# Patient Record
Sex: Female | Born: 1965
Health system: Southern US, Community
[De-identification: ages and names within clinical notes are randomized; demographics above are authoritative.]

## PROBLEM LIST (undated history)

## (undated) DIAGNOSIS — E039 Hypothyroidism, unspecified: Secondary | ICD-10-CM

## (undated) DIAGNOSIS — Z720 Tobacco use: Secondary | ICD-10-CM

## (undated) DIAGNOSIS — F419 Anxiety disorder, unspecified: Secondary | ICD-10-CM

## (undated) DIAGNOSIS — E785 Hyperlipidemia, unspecified: Secondary | ICD-10-CM

## (undated) DIAGNOSIS — K5190 Ulcerative colitis, unspecified, without complications: Secondary | ICD-10-CM

## (undated) HISTORY — DX: Ulcerative colitis, unspecified, without complications: K51.90

## (undated) HISTORY — DX: Hypothyroidism, unspecified: E03.9

## (undated) HISTORY — DX: Tobacco use: Z72.0

## (undated) HISTORY — PX: CYSTECTOMY: SUR359

## (undated) HISTORY — PX: ABLATION: SHX5711

## (undated) HISTORY — DX: Anxiety disorder, unspecified: F41.9

## (undated) HISTORY — DX: Hyperlipidemia, unspecified: E78.5

---

## 2018-12-27 DIAGNOSIS — Z23 Encounter for immunization: Secondary | ICD-10-CM | POA: Diagnosis not present

## 2018-12-27 DIAGNOSIS — E039 Hypothyroidism, unspecified: Secondary | ICD-10-CM | POA: Diagnosis not present

## 2018-12-27 DIAGNOSIS — Z13220 Encounter for screening for lipoid disorders: Secondary | ICD-10-CM | POA: Diagnosis not present

## 2018-12-27 DIAGNOSIS — K5190 Ulcerative colitis, unspecified, without complications: Secondary | ICD-10-CM | POA: Diagnosis not present

## 2018-12-27 DIAGNOSIS — Z1159 Encounter for screening for other viral diseases: Secondary | ICD-10-CM | POA: Diagnosis not present

## 2019-01-23 DIAGNOSIS — E039 Hypothyroidism, unspecified: Secondary | ICD-10-CM | POA: Diagnosis not present

## 2019-03-30 ENCOUNTER — Other Ambulatory Visit: Payer: Self-pay | Admitting: Family Medicine

## 2019-03-30 ENCOUNTER — Other Ambulatory Visit (HOSPITAL_COMMUNITY)
Admission: RE | Admit: 2019-03-30 | Discharge: 2019-03-30 | Disposition: A | Discharge status: Home / Self Care | Payer: BC Managed Care – PPO | Source: Ambulatory Visit | Attending: Family Medicine | Admitting: Family Medicine

## 2019-03-30 DIAGNOSIS — Z01411 Encounter for gynecological examination (general) (routine) with abnormal findings: Secondary | ICD-10-CM | POA: Diagnosis not present

## 2019-03-30 DIAGNOSIS — Z0000 Encounter for general adult medical examination without abnormal findings: Secondary | ICD-10-CM | POA: Diagnosis not present

## 2019-04-05 LAB — CYTOLOGY - PAP
Comment: NEGATIVE
Diagnosis: NEGATIVE
High risk HPV: NEGATIVE

## 2019-11-19 DIAGNOSIS — K047 Periapical abscess without sinus: Secondary | ICD-10-CM | POA: Diagnosis not present

## 2019-11-19 DIAGNOSIS — K0381 Cracked tooth: Secondary | ICD-10-CM | POA: Diagnosis not present

## 2019-11-19 DIAGNOSIS — G501 Atypical facial pain: Secondary | ICD-10-CM | POA: Diagnosis not present

## 2019-11-19 DIAGNOSIS — Z719 Counseling, unspecified: Secondary | ICD-10-CM | POA: Diagnosis not present

## 2020-04-01 DIAGNOSIS — Z0000 Encounter for general adult medical examination without abnormal findings: Secondary | ICD-10-CM | POA: Diagnosis not present

## 2020-04-01 DIAGNOSIS — E7800 Pure hypercholesterolemia, unspecified: Secondary | ICD-10-CM | POA: Diagnosis not present

## 2020-04-01 DIAGNOSIS — E039 Hypothyroidism, unspecified: Secondary | ICD-10-CM | POA: Diagnosis not present

## 2020-04-01 DIAGNOSIS — Z23 Encounter for immunization: Secondary | ICD-10-CM | POA: Diagnosis not present

## 2020-05-21 DIAGNOSIS — E039 Hypothyroidism, unspecified: Secondary | ICD-10-CM | POA: Diagnosis not present

## 2020-06-11 DIAGNOSIS — Z20822 Contact with and (suspected) exposure to covid-19: Secondary | ICD-10-CM | POA: Diagnosis not present

## 2020-06-12 DIAGNOSIS — Z20822 Contact with and (suspected) exposure to covid-19: Secondary | ICD-10-CM | POA: Diagnosis not present

## 2020-06-18 DIAGNOSIS — Z20822 Contact with and (suspected) exposure to covid-19: Secondary | ICD-10-CM | POA: Diagnosis not present

## 2020-06-19 DIAGNOSIS — Z20822 Contact with and (suspected) exposure to covid-19: Secondary | ICD-10-CM | POA: Diagnosis not present

## 2020-06-20 DIAGNOSIS — Z20822 Contact with and (suspected) exposure to covid-19: Secondary | ICD-10-CM | POA: Diagnosis not present

## 2020-07-23 DIAGNOSIS — E039 Hypothyroidism, unspecified: Secondary | ICD-10-CM | POA: Diagnosis not present

## 2021-01-23 DIAGNOSIS — M2550 Pain in unspecified joint: Secondary | ICD-10-CM | POA: Diagnosis not present

## 2021-01-23 DIAGNOSIS — D1720 Benign lipomatous neoplasm of skin and subcutaneous tissue of unspecified limb: Secondary | ICD-10-CM | POA: Diagnosis not present

## 2021-01-23 DIAGNOSIS — Z23 Encounter for immunization: Secondary | ICD-10-CM | POA: Diagnosis not present

## 2021-01-23 DIAGNOSIS — K5190 Ulcerative colitis, unspecified, without complications: Secondary | ICD-10-CM | POA: Diagnosis not present

## 2021-02-05 ENCOUNTER — Other Ambulatory Visit: Payer: Self-pay | Admitting: Sports Medicine

## 2021-02-05 ENCOUNTER — Ambulatory Visit
Admission: RE | Admit: 2021-02-05 | Discharge: 2021-02-05 | Disposition: A | Discharge status: Home / Self Care | Payer: BC Managed Care – PPO | Source: Ambulatory Visit | Attending: Sports Medicine | Admitting: Sports Medicine

## 2021-02-05 DIAGNOSIS — M25551 Pain in right hip: Secondary | ICD-10-CM | POA: Diagnosis not present

## 2021-02-05 DIAGNOSIS — M25561 Pain in right knee: Secondary | ICD-10-CM | POA: Diagnosis not present

## 2021-02-05 DIAGNOSIS — R52 Pain, unspecified: Secondary | ICD-10-CM

## 2021-02-14 DIAGNOSIS — D1720 Benign lipomatous neoplasm of skin and subcutaneous tissue of unspecified limb: Secondary | ICD-10-CM | POA: Diagnosis not present

## 2021-03-21 DIAGNOSIS — D171 Benign lipomatous neoplasm of skin and subcutaneous tissue of trunk: Secondary | ICD-10-CM | POA: Diagnosis not present

## 2021-03-21 DIAGNOSIS — D1721 Benign lipomatous neoplasm of skin and subcutaneous tissue of right arm: Secondary | ICD-10-CM | POA: Diagnosis not present

## 2021-04-03 DIAGNOSIS — E7800 Pure hypercholesterolemia, unspecified: Secondary | ICD-10-CM | POA: Diagnosis not present

## 2021-04-03 DIAGNOSIS — K5190 Ulcerative colitis, unspecified, without complications: Secondary | ICD-10-CM | POA: Diagnosis not present

## 2021-04-03 DIAGNOSIS — Z0000 Encounter for general adult medical examination without abnormal findings: Secondary | ICD-10-CM | POA: Diagnosis not present

## 2021-04-03 DIAGNOSIS — Z23 Encounter for immunization: Secondary | ICD-10-CM | POA: Diagnosis not present

## 2021-04-03 DIAGNOSIS — E039 Hypothyroidism, unspecified: Secondary | ICD-10-CM | POA: Diagnosis not present

## 2021-04-03 DIAGNOSIS — F411 Generalized anxiety disorder: Secondary | ICD-10-CM | POA: Diagnosis not present

## 2021-04-24 ENCOUNTER — Encounter: Payer: Self-pay | Admitting: Gastroenterology

## 2021-05-15 DIAGNOSIS — U071 COVID-19: Secondary | ICD-10-CM | POA: Diagnosis not present

## 2021-05-29 ENCOUNTER — Ambulatory Visit (INDEPENDENT_AMBULATORY_CARE_PROVIDER_SITE_OTHER): Payer: BC Managed Care – PPO | Admitting: Gastroenterology

## 2021-05-29 ENCOUNTER — Encounter: Payer: Self-pay | Admitting: Gastroenterology

## 2021-05-29 VITALS — BP 102/80 | HR 83 | Ht 67.0 in | Wt 186.0 lb

## 2021-05-29 DIAGNOSIS — K5190 Ulcerative colitis, unspecified, without complications: Secondary | ICD-10-CM

## 2021-05-29 MED ORDER — PLENVU 140 G PO SOLR: 140.0000 g | ORAL | 0 refills | Status: DC

## 2021-05-29 NOTE — Progress Notes (Signed)
[facility] Consult Note:  History: Ashlee Hall 05/29/2021  Referring provider: Glenis Smoker, MD  Reason for consult/chief complaint: Ulcerative Colitis (Patient is not having any issues she is just establishing care.)   Subjective  HPI:  This is a very pleasant 56 year old woman referred by primary care for ongoing management of ulcerative colitis.  Records from her previous GI physician in [place] are not presently available.  She tried to get them, but was unable to do so because that physician passed away, and his practice could not be reached to obtain records.  She recalls being diagnosed about 2010 when she had abdominal pain bleeding and diarrhea.  To the best of her recollection, the condition was involving at least the entire left side of the colon and perhaps more than that.  She believes she has had 3 or 4 colonoscopies, not sure if there may have been polyps on one.  The only treatment she recalls is Lialda (mesalamine), which worked well for many years and she did not have flares requiring prednisone or escalation of therapy.  She stopped the medicine a few years ago because of its cost and they also moved to the state and changed insurance.  Despite that, she has remained under good control without recurrent symptoms. Ashlee Hall also recalls having had a sigmoidoscopy in her 36s and was diagnosed with possible proctitis, though records of that are also unavailable.  She denies painful eye redness, joint swelling or rash.  ROS:  Review of Systems  Constitutional:  Negative for appetite change and unexpected weight change.  HENT:  Negative for mouth sores and voice change.   Eyes:  Negative for pain and redness.  Respiratory:  Negative for cough and shortness of breath.   Cardiovascular:  Negative for chest pain and palpitations.  Genitourinary:  Negative for dysuria and hematuria.  Musculoskeletal:  Negative for arthralgias and myalgias.   Skin:  Negative for pallor and rash.  Neurological:  Negative for weakness and headaches.  Hematological:  Negative for adenopathy.    Past Medical History: Past Medical History:  Diagnosis Date   Anxiety    Hyperlipidemia    Hypothyroidism    Tobacco use    Ulcerative colitis ([place])      Past Surgical History: Past Surgical History:  Procedure Laterality Date   ABLATION     CYSTECTOMY       Family History: Family History  Problem Relation Age of Onset   COPD Mother    Hypertension Mother    CVA Mother    Pulmonary embolism Father    Drug abuse Brother    Crohn's disease Son    Colon cancer Neg Hx    Stomach cancer Neg Hx     Social History: Social History   Socioeconomic History   Marital status: Unknown    Spouse name: Not on file   Number of children: Not on file   Years of education: Not on file   Highest education level: Not on file  Occupational History   Not on file  Tobacco Use   Smoking status: Every Day    Types: Cigarettes   Smokeless tobacco: Never  Vaping Use   Vaping Use: Never used  Substance and Sexual Activity   Alcohol use: Yes   Drug use: Never   Sexual activity: Yes  Other Topics Concern   Not on file  Social History Narrative   Not on file   Social Determinants of Health   Financial  Resource Strain: Not on file  Food Insecurity: Not on file  Transportation Needs: Not on file  Physical Activity: Not on file  Stress: Not on file  Social Connections: Not on file    Allergies: Not on File  Outpatient Meds: Current Outpatient Medications  Medication Sig Dispense Refill   B Complex Vitamins (VITAMIN B COMPLEX) TABS 1 tablet     cyanocobalamin 1000 MCG tablet Take by mouth.     levothyroxine (SYNTHROID) 137 MCG tablet TAKE 1 TABLET BY MOUTH EVERY MORNING ON EMPTY STOMACH     PARoxetine (PAXIL) 40 MG tablet TAKE 1 TABLET BY MOUTH EVERY DAY IN THE MORNING     PEG-KCl-NaCl-NaSulf-Na Asc-C (PLENVU) 140 g SOLR Take 140 g by  mouth as directed. 1 each 0   No current facility-administered medications for this visit.      ___________________________________________________________________ Objective   Exam:  BP 102/80    Pulse 83    Ht 5\' 7"  (1.702 m)    Wt 186 lb (84.4 kg)    BMI 29.13 kg/m  Wt Readings from Last 3 Encounters:  05/29/21 186 lb (84.4 kg)    General: Well-appearing Eyes: sclera anicteric, no redness ENT: oral mucosa moist without lesions, no cervical or supraclavicular lymphadenopathy CV: RRR without murmur, S1/S2, no JVD, no peripheral edema Resp: clear to auscultation bilaterally, normal RR and effort noted GI: soft, no tenderness, with active bowel sounds. No guarding or palpable organomegaly noted. Skin; warm and dry, no rash or jaundice noted Neuro: awake, alert and oriented x 3. Normal gross motor function and fluent speech  Labs: 04/03/2021  TSH 5.18, free T41.2 CBC CMP normal   Assessment: Encounter Diagnosis  Name Primary?   Ulcerative colitis without complications, unspecified location [facility]) Yes    Ulcerative colitis diagnosed approximately 2010, extent of disease unknown but may have been more than left-sided colitis based on patient's recollection.  She had reportedly excellent response and long remission with only mesalamine, and has remained under good symptomatic control off that medicine for the last few years.  No extraintestinal manifestations at present.  Plan:  Colonoscopy to assess disease activity and take biopsies to assess for microscopic activity even if not overtly active. She was agreeable after discussion of procedure and risks.  The benefits and risks of the planned procedure were described in detail with the patient or (when appropriate) their health care proxy.  Risks were outlined as including, but not limited to, bleeding, infection, perforation, adverse medication reaction leading to cardiac or pulmonary decompensation, pancreatitis (if ERCP).   The limitation of incomplete mucosal visualization was also discussed.  No guarantees or warranties were given.  Based on the colonoscopy findings, we can then determine whether or not she requires long-term medicine for this.  She is up-to-date with primary care on health maintenance and lab testing. I recommended she contact primary care to arrange a bone density study given her history of IBD.   Thank you for the courtesy of this consult.  Please call me with any questions or concerns.  Nelida Meuse III  CC: Referring provider noted above

## 2021-05-29 NOTE — Patient Instructions (Signed)
If you are age 54 or older, your body mass index should be between 23-30. Your Body mass index is 29.13 kg/m. If this is out of the aforementioned range listed, please consider follow up with your Primary Care Provider.  If you are age 79 or younger, your body mass index should be between 19-25. Your Body mass index is 29.13 kg/m. If this is out of the aformentioned range listed, please consider follow up with your Primary Care Provider.   ________________________________________________________  The [facility] providers would like to encourage you to use [facility] to communicate with providers for non-urgent requests or questions.  Due to long hold times on the telephone, sending your provider a message by [facility] may be a faster and more efficient way to get a response.  Please allow 48 business hours for a response.  Please remember that this is for non-urgent requests.  _______________________________________________________  Ashlee Hall have been scheduled for a colonoscopy. Please follow written instructions given to you at your visit today.  Please pick up your prep supplies at the pharmacy within the next 1-3 days. If you use inhalers (even only as needed), please bring them with you on the day of your procedure.  It was a pleasure to see you today!  Thank you for trusting me with your gastrointestinal care!

## 2021-06-16 ENCOUNTER — Telehealth: Payer: Self-pay | Admitting: Gastroenterology

## 2021-06-16 ENCOUNTER — Other Ambulatory Visit: Payer: Self-pay | Admitting: Family Medicine

## 2021-06-16 DIAGNOSIS — Z1231 Encounter for screening mammogram for malignant neoplasm of breast: Secondary | ICD-10-CM

## 2021-06-16 NOTE — Telephone Encounter (Signed)
Inbound call from patient states plenvu is not covered by insurance and need an alternative.

## 2021-06-17 ENCOUNTER — Other Ambulatory Visit: Payer: Self-pay

## 2021-06-17 MED ORDER — PEG 3350-KCL-NA BICARB-NACL 420 G PO SOLR: 4000.0000 mL | Freq: Once | ORAL | 0 refills | Status: AC

## 2021-06-17 NOTE — Telephone Encounter (Signed)
New prep replacement sent ( Golytely ). I have also mailed new instructions to the patients home address on file.

## 2021-06-20 DIAGNOSIS — M25551 Pain in right hip: Secondary | ICD-10-CM | POA: Diagnosis not present

## 2021-06-27 ENCOUNTER — Ambulatory Visit
Admission: RE | Admit: 2021-06-27 | Discharge: 2021-06-27 | Disposition: A | Discharge status: Home / Self Care | Payer: BC Managed Care – PPO | Source: Ambulatory Visit | Attending: Family Medicine | Admitting: Family Medicine

## 2021-06-27 DIAGNOSIS — Z1231 Encounter for screening mammogram for malignant neoplasm of breast: Secondary | ICD-10-CM | POA: Diagnosis not present

## 2021-07-10 ENCOUNTER — Encounter: Payer: BC Managed Care – PPO | Admitting: Gastroenterology

## 2021-07-22 ENCOUNTER — Encounter: Payer: Self-pay | Admitting: Gastroenterology

## 2021-07-30 ENCOUNTER — Encounter: Payer: Self-pay | Admitting: Gastroenterology

## 2021-07-30 ENCOUNTER — Ambulatory Visit (AMBULATORY_SURGERY_CENTER): Payer: BC Managed Care – PPO | Admitting: Gastroenterology

## 2021-07-30 VITALS — BP 110/61 | HR 59 | Temp 96.8°F | Resp 9 | Ht 67.0 in | Wt 186.0 lb

## 2021-07-30 DIAGNOSIS — D124 Benign neoplasm of descending colon: Secondary | ICD-10-CM | POA: Diagnosis not present

## 2021-07-30 DIAGNOSIS — K621 Rectal polyp: Secondary | ICD-10-CM

## 2021-07-30 DIAGNOSIS — K5100 Ulcerative (chronic) pancolitis without complications: Secondary | ICD-10-CM

## 2021-07-30 DIAGNOSIS — K635 Polyp of colon: Secondary | ICD-10-CM | POA: Diagnosis not present

## 2021-07-30 DIAGNOSIS — D122 Benign neoplasm of ascending colon: Secondary | ICD-10-CM

## 2021-07-30 MED ORDER — SODIUM CHLORIDE 0.9 % IV SOLN: 500.0000 mL | Freq: Once | INTRAVENOUS | Status: DC

## 2021-07-30 NOTE — Progress Notes (Signed)
Called to room to assist during endoscopic procedure.  Patient ID and intended procedure confirmed with present staff. Received instructions for my participation in the procedure from the performing physician.  

## 2021-07-30 NOTE — Progress Notes (Signed)
History and Physical: ? This patient presents for endoscopic testing for: ?Encounter Diagnosis  ?Name Primary?  ? Ulcerative pancolitis without complication ([place]) Yes  ? ? ?Clinical details in 05/29/21 [facility] office consult note. ?UC Dx 2010, long term remission off meds. ?Colonoscopy to assess disease activity and take dysplasia surveillance biopsies. ? ?ROS: ?Patient denies chest pain or shortness of breath ? ? ?Past Medical History: ?Past Medical History:  ?Diagnosis Date  ? Anxiety   ? Hyperlipidemia   ? Hypothyroidism   ? Tobacco use   ? Ulcerative colitis ([place])   ? ? ? ?Past Surgical History: ?Past Surgical History:  ?Procedure Laterality Date  ? ABLATION    ? CYSTECTOMY    ? ? ?Allergies: ?Not on File ? ?Outpatient Meds: ?Current Outpatient Medications  ?Medication Sig Dispense Refill  ? B Complex Vitamins (VITAMIN B COMPLEX) TABS 1 tablet    ? cyanocobalamin 1000 MCG tablet Take by mouth.    ? levothyroxine (SYNTHROID) 137 MCG tablet TAKE 1 TABLET BY MOUTH EVERY MORNING ON EMPTY STOMACH    ? PARoxetine (PAXIL) 40 MG tablet TAKE 1 TABLET BY MOUTH EVERY DAY IN THE MORNING    ? ?Current Facility-Administered Medications  ?Medication Dose Route Frequency Provider Last Rate Last Admin  ? 0.9 %  sodium chloride infusion  500 mL Intravenous Once Doran Stabler, MD      ? ? ? ? ?___________________________________________________________________ ?Objective  ? ?Exam: ? ?BP 104/61 (BP Location: Right Arm, Patient Position: Sitting, Cuff Size: Normal)   Pulse 97   Temp (!) 96.8 ?F (36 ?C) (Temporal)   Ht '5\' 7"'$  (1.702 m)   Wt 186 lb (84.4 kg)   LMP  (LMP Unknown)   SpO2 96%   BMI 29.13 kg/m?  ? ?CV: RRR without murmur, S1/S2 ?Resp: clear to auscultation bilaterally, normal RR and effort noted ?GI: soft, no tenderness, with active bowel sounds. ? ? ?Assessment: ?Encounter Diagnosis  ?Name Primary?  ? Ulcerative pancolitis without complication ([place]) Yes  ? ? ? ?Plan: ?Colonoscopy ? The benefits and risks of the  planned procedure were described in detail with the patient or (when appropriate) their health care proxy.  Risks were outlined as including, but not limited to, bleeding, infection, perforation, adverse medication reaction leading to cardiac or pulmonary decompensation, pancreatitis (if ERCP).  The limitation of incomplete mucosal visualization was also discussed.  No guarantees or warranties were given. ? ? ? ?The patient is appropriate for an endoscopic procedure in the ambulatory setting. ? ? - Wilfrid Lund, MD ? ? ? ? ?

## 2021-07-30 NOTE — Progress Notes (Signed)
Sedate, gd SR, tolerated procedure well, VSS, report to RN 

## 2021-07-30 NOTE — Progress Notes (Signed)
Vitals-DT  Pt's states no medical or surgical changes since previsit or office visit.  

## 2021-07-30 NOTE — Patient Instructions (Addendum)
Handouts provided on polyps and hemorrhoids.   YOU HAD AN ENDOSCOPIC PROCEDURE TODAY AT THE [facility]:   Refer to the procedure report that was given to you for any specific questions about what was found during the examination.  If the procedure report does not answer your questions, please call your gastroenterologist to clarify.  If you requested that your care partner not be given the details of your procedure findings, then the procedure report has been included in a sealed envelope for you to review at your convenience later.  YOU SHOULD EXPECT: Some feelings of bloating in the abdomen. Passage of more gas than usual.  Walking can help get rid of the air that was put into your GI tract during the procedure and reduce the bloating. If you had a lower endoscopy (such as a colonoscopy or flexible sigmoidoscopy) you may notice spotting of blood in your stool or on the toilet paper. If you underwent a bowel prep for your procedure, you may not have a normal bowel movement for a few days.  Please Note:  You might notice some irritation and congestion in your nose or some drainage.  This is from the oxygen used during your procedure.  There is no need for concern and it should clear up in a day or so.  SYMPTOMS TO REPORT IMMEDIATELY:  Following lower endoscopy (colonoscopy or flexible sigmoidoscopy):  Excessive amounts of blood in the stool  Significant tenderness or worsening of abdominal pains  Swelling of the abdomen that is new, acute  Fever of 100F or higher  For urgent or emergent issues, a gastroenterologist can be reached at any hour by calling [phone]. Do not use MyChart messaging for urgent concerns.    DIET:  We do recommend a small meal at first, but then you may proceed to your regular diet.  Drink plenty of fluids but you should avoid alcoholic beverages for 24 hours.  ACTIVITY:  You should plan to take it easy for the rest of today and you should NOT DRIVE  or use heavy machinery until tomorrow (because of the sedation medicines used during the test).    FOLLOW UP: Our staff will call the number listed on your records 48-72 hours following your procedure to check on you and address any questions or concerns that you may have regarding the information given to you following your procedure. If we do not reach you, we will leave a message.  We will attempt to reach you two times.  During this call, we will ask if you have developed any symptoms of COVID 19. If you develop any symptoms (ie: fever, flu-like symptoms, shortness of breath, cough etc.) before then, please call [phone].  If you test positive for Covid 19 in the 2 weeks post procedure, please call and report this information to us.    If any biopsies were taken you will be contacted by phone or by letter within the next 1-3 weeks.  Please call us at [phone] if you have not heard about the biopsies in 3 weeks.    SIGNATURES/CONFIDENTIALITY: You and/or your care partner have signed paperwork which will be entered into your electronic medical record.  These signatures attest to the fact that that the information above on your After Visit Summary has been reviewed and is understood.  Full responsibility of the confidentiality of this discharge information lies with you and/or your care-partner.  

## 2021-07-30 NOTE — Op Note (Signed)
[facility] ?Patient Name: Ashlee Hall ?Procedure Date: 07/30/2021 9:48 AM ?MRN: [id number] ?Endoscopist: Estill Cotta. Loletha Carrow , MD ?Age: 56 ?Referring MD:  ?Date of Birth: 1965/12/11 ?Gender: Female ?Account #: [id number] ?Procedure:                Colonoscopy ?Indications:              Disease activity assessment of chronic ulcerative  ?                          pancolitis, and dysplasia screening. ?                          Dx 2010, remission off meds several years. ?Medicines:                Monitored Anesthesia Care ?Procedure:                Pre-Anesthesia Assessment: ?                          - Prior to the procedure, a History and Physical  ?                          was performed, and patient medications and  ?                          allergies were reviewed. The patient's tolerance of  ?                          previous anesthesia was also reviewed. The risks  ?                          and benefits of the procedure and the sedation  ?                          options and risks were discussed with the patient.  ?                          All questions were answered, and informed consent  ?                          was obtained. Prior Anticoagulants: The patient has  ?                          taken no previous anticoagulant or antiplatelet  ?                          agents. ASA Grade Assessment: II - A patient with  ?                          mild systemic disease. After reviewing the risks  ?                          and benefits, the patient was deemed in  ?  satisfactory condition to undergo the procedure. ?                          After obtaining informed consent, the colonoscope  ?                          was passed under direct vision. Throughout the  ?                          procedure, the patient's blood pressure, pulse, and  ?                          oxygen saturations were monitored continuously. The  ?                          Olympus CF-HQ190L ([id number])  Colonoscope was  ?                          introduced through the anus and advanced to the the  ?                          terminal ileum, with identification of the  ?                          appendiceal orifice and IC valve. The colonoscopy  ?                          was performed without difficulty. The patient  ?                          tolerated the procedure well. The quality of the  ?                          bowel preparation was good after lavage. The  ?                          terminal ileum, ileocecal valve, appendiceal  ?                          orifice, and rectum were photographed. The bowel  ?                          preparation used was GoLYTELY. ?Scope In: 9:58:13 AM ?Scope Out: 10:33:44 AM ?Scope Withdrawal Time: 0 hours 30 minutes 50 seconds  ?Total Procedure Duration: 0 hours 35 minutes 31 seconds  ?Findings:                 The perianal and digital rectal examinations were  ?                          normal. ?                          The terminal ileum appeared normal. ?  Three flat and semi-sessile polyps were found in  ?                          the proximal ascending colon. The polyps were 2 to  ?                          10 mm in size. These polyps were removed with a  ?                          cold snare. Resection and retrieval were complete. ?                          A 4 mm polyp was found in the descending colon. The  ?                          polyp was semi-sessile. The polyp was removed with  ?                          a cold snare. Resection and retrieval were complete. ?                          Normal mucosa was found in the entire colon  ?                          (examined under WL and NBI). Four biopsies each  ?                          were obtained in the rectum, in the proximal  ?                          sigmoid colon, in the distal sigmoid colon, in the  ?                          descending colon, in the proximal transverse colon,  ?                           in the mid transverse colon, in the distal  ?                          transverse colon, in the proximal ascending colon,  ?                          in the distal ascending colon and in the cecum with  ?                          cold forceps for evaluation of inflammatory bowel  ?                          disease. ?                          External hemorrhoids were found. ?  The exam was otherwise without abnormality on  ?                          direct and retroflexion views. ?Complications:            No immediate complications. ?Estimated Blood Loss:     Estimated blood loss was minimal. ?Impression:               - The examined portion of the ileum was normal. ?                          - Three 2 to 10 mm polyps in the proximal ascending  ?                          colon, removed with a cold snare. Resected and  ?                          retrieved. ?                          - One 4 mm polyp in the descending colon, removed  ?                          with a cold snare. Resected and retrieved. ?                          - Normal mucosa in the entire examined colon. ?                          - External hemorrhoids. ?                          - The examination was otherwise normal on direct  ?                          and retroflexion views. ?                          - Four biopsies were obtained in the rectum, in the  ?                          proximal sigmoid colon, in the distal sigmoid  ?                          colon, in the descending colon, in the proximal  ?                          transverse colon, in the mid transverse colon, in  ?                          the distal transverse colon, in the proximal  ?                          ascending colon, in the distal ascending colon and  ?  in the cecum. ?Recommendation:           - Patient has a contact number available for  ?                          emergencies. The signs and symptoms  of potential  ?                          delayed complications were discussed with the  ?                          patient. Return to normal activities tomorrow.  ?                          Written discharge instructions were provided to the  ?                          patient. ?                          - Resume previous diet. ?                          - Continue present medications. ?                          - Await pathology results. ?                          - Repeat colonoscopy is recommended for  ?                          surveillance. The colonoscopy date will be  ?                          determined after pathology results from today's  ?                          exam become available for review. ?Seaton Hofmann L. Loletha Carrow, MD ?07/30/2021 10:44:30 AM ?This report has been signed electronically. ?

## 2021-08-01 ENCOUNTER — Telehealth: Payer: Self-pay

## 2021-08-01 ENCOUNTER — Encounter: Payer: Self-pay | Admitting: Gastroenterology

## 2021-08-01 NOTE — Telephone Encounter (Signed)
?  Follow up Call- ? ? ?  07/30/2021  ?  9:24 AM  ?Call back number  ?Post procedure Call Back phone  # [phone]  ?Permission to leave phone message Yes  ?  ? ?Patient questions: ? ?Do you have a fever, pain , or abdominal swelling? No. ?Pain Score  0 * ? ?Have you tolerated food without any problems? Yes.   ? ?Have you been able to return to your normal activities? Yes.   ? ?Do you have any questions about your discharge instructions: ?Diet   No. ?Medications  No. ?Follow up visit  No. ? ?Do you have questions or concerns about your Care? No. ? ?Actions: ?* If pain score is 4 or above: ?No action needed, pain <4. ? ? ?

## 2021-10-10 DIAGNOSIS — F411 Generalized anxiety disorder: Secondary | ICD-10-CM | POA: Diagnosis not present

## 2021-10-10 DIAGNOSIS — K5190 Ulcerative colitis, unspecified, without complications: Secondary | ICD-10-CM | POA: Diagnosis not present

## 2021-10-10 DIAGNOSIS — Z79899 Other long term (current) drug therapy: Secondary | ICD-10-CM | POA: Diagnosis not present

## 2021-10-10 DIAGNOSIS — E039 Hypothyroidism, unspecified: Secondary | ICD-10-CM | POA: Diagnosis not present

## 2021-12-15 DIAGNOSIS — E039 Hypothyroidism, unspecified: Secondary | ICD-10-CM | POA: Diagnosis not present

## 2022-04-17 DIAGNOSIS — E039 Hypothyroidism, unspecified: Secondary | ICD-10-CM | POA: Diagnosis not present

## 2022-04-17 DIAGNOSIS — Z79899 Other long term (current) drug therapy: Secondary | ICD-10-CM | POA: Diagnosis not present

## 2022-04-17 DIAGNOSIS — Z0000 Encounter for general adult medical examination without abnormal findings: Secondary | ICD-10-CM | POA: Diagnosis not present

## 2022-04-17 DIAGNOSIS — Z23 Encounter for immunization: Secondary | ICD-10-CM | POA: Diagnosis not present

## 2022-04-17 DIAGNOSIS — E7800 Pure hypercholesterolemia, unspecified: Secondary | ICD-10-CM | POA: Diagnosis not present

## 2022-04-28 ENCOUNTER — Other Ambulatory Visit (HOSPITAL_COMMUNITY): Payer: Self-pay | Admitting: Family Medicine

## 2022-04-28 DIAGNOSIS — E786 Lipoprotein deficiency: Secondary | ICD-10-CM

## 2022-05-08 ENCOUNTER — Ambulatory Visit (HOSPITAL_COMMUNITY)
Admission: RE | Admit: 2022-05-08 | Discharge: 2022-05-08 | Disposition: A | Discharge status: Home / Self Care | Payer: BC Managed Care – PPO | Source: Ambulatory Visit | Attending: Family Medicine | Admitting: Family Medicine

## 2022-05-08 DIAGNOSIS — E786 Lipoprotein deficiency: Secondary | ICD-10-CM

## 2022-06-04 DIAGNOSIS — R1310 Dysphagia, unspecified: Secondary | ICD-10-CM | POA: Diagnosis not present

## 2022-06-04 DIAGNOSIS — R0981 Nasal congestion: Secondary | ICD-10-CM | POA: Diagnosis not present

## 2022-06-04 DIAGNOSIS — J029 Acute pharyngitis, unspecified: Secondary | ICD-10-CM | POA: Diagnosis not present

## 2022-12-13 IMAGING — MG MM DIGITAL SCREENING BILAT W/ TOMO AND CAD
8 series · 8 of 24 positions shown · non-contrast
Comparison: None.

CLINICAL DATA: Screening.

EXAM:
DIGITAL SCREENING BILATERAL MAMMOGRAM WITH TOMOSYNTHESIS AND CAD
TECHNIQUE: Bilateral screening digital craniocaudal and mediolateral oblique
mammograms were obtained. Bilateral screening digital breast
tomosynthesis was performed. The images were evaluated with
computer-aided detection.

[L CC synth-2D]
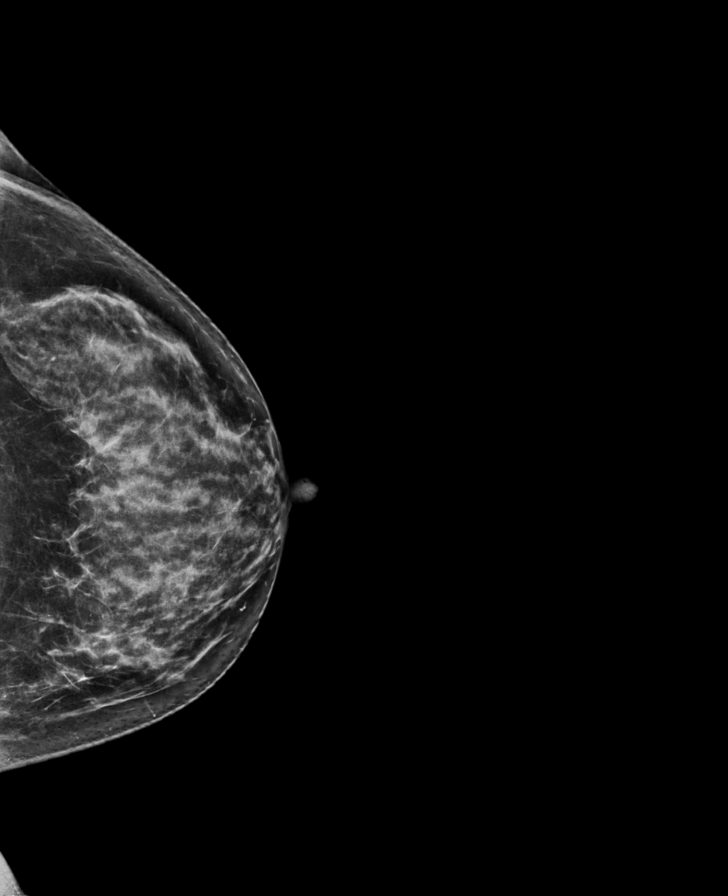

[L MLO synth-2D]
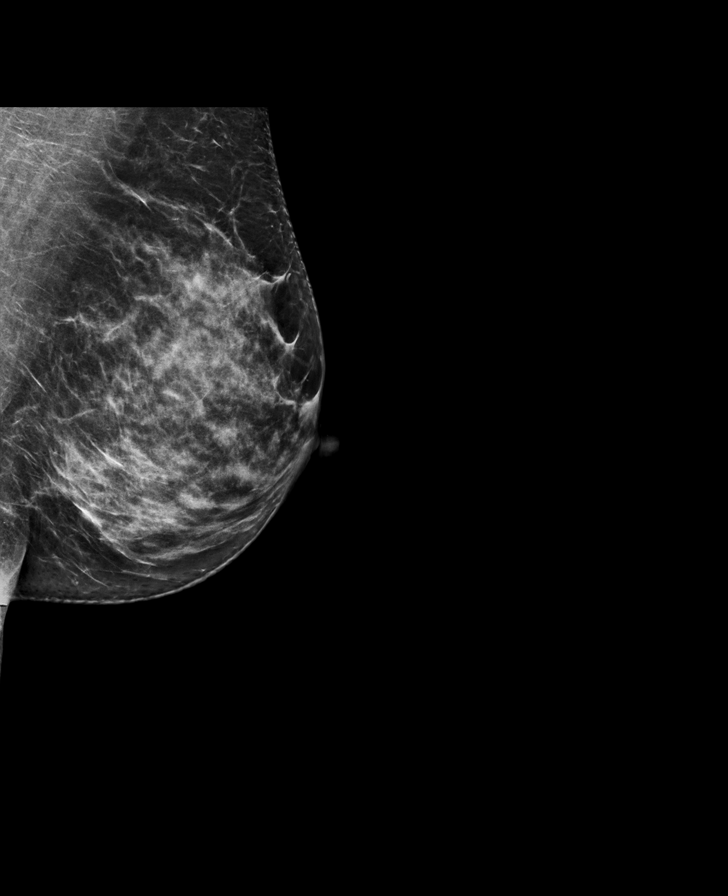

[R MLO synth-2D]
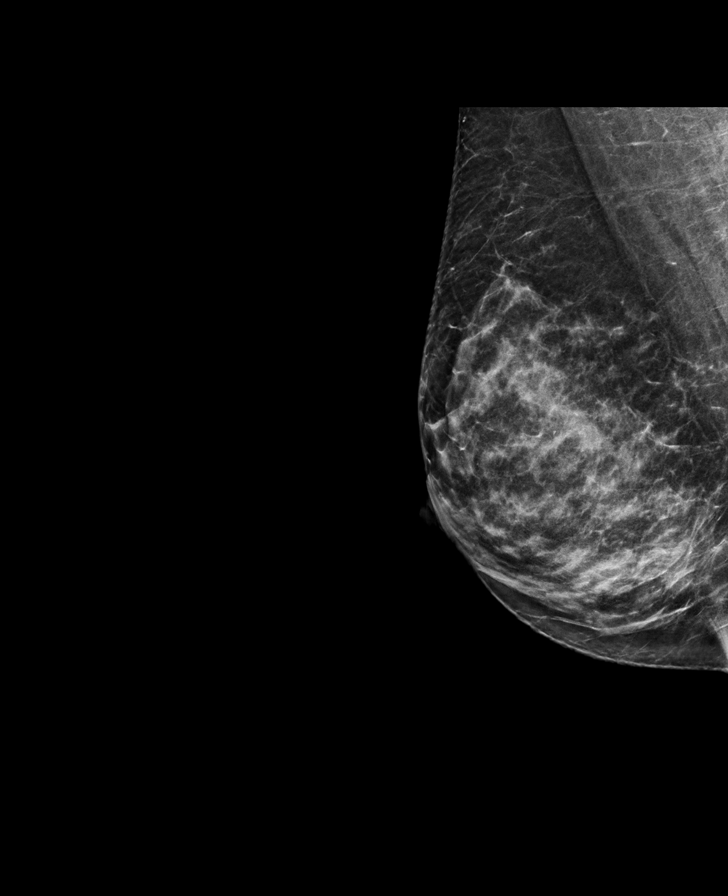

[R CC synth-2D]
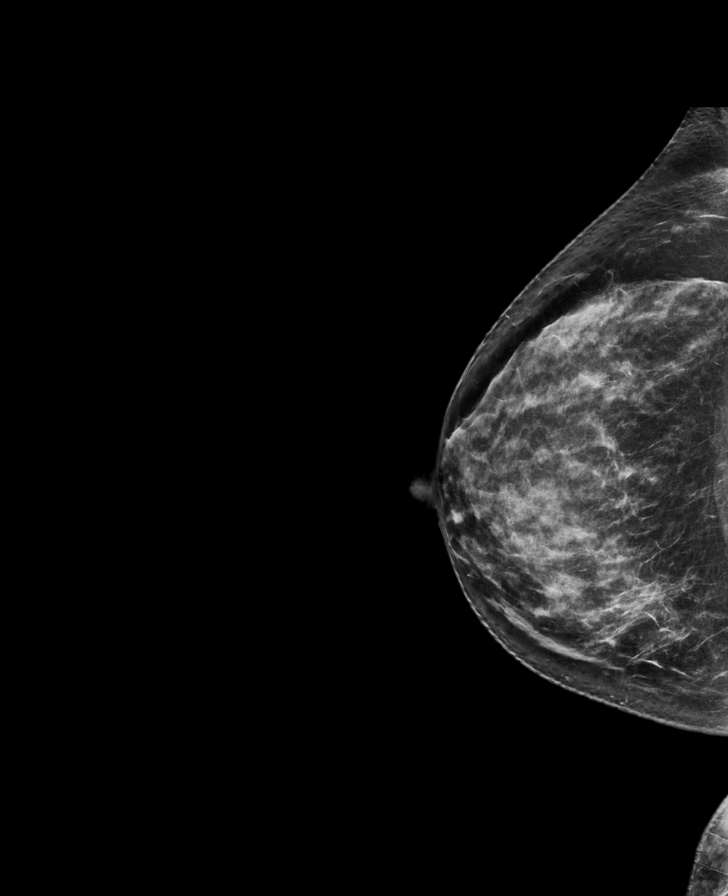

[L MLO tomo · tomo slice 38/75.0]
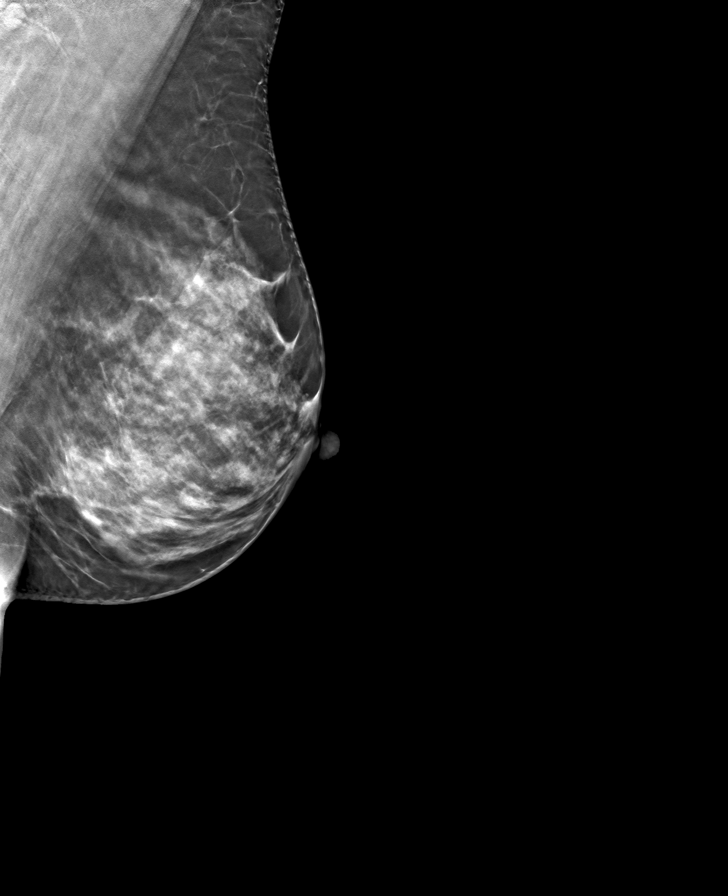

[R MLO tomo · tomo slice 35/69.0]
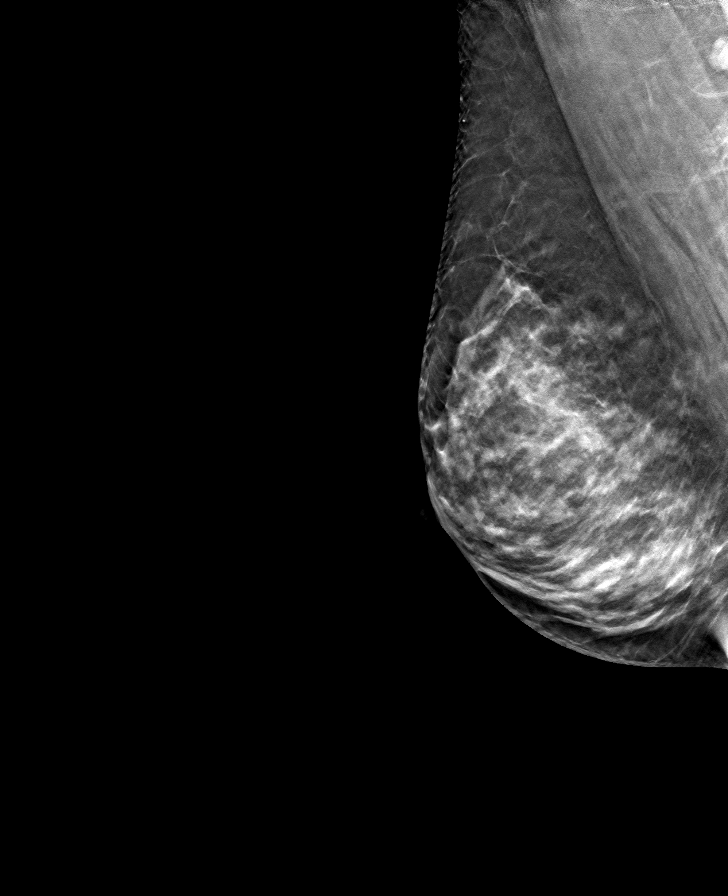

[R CC tomo · tomo slice 37/72.0]
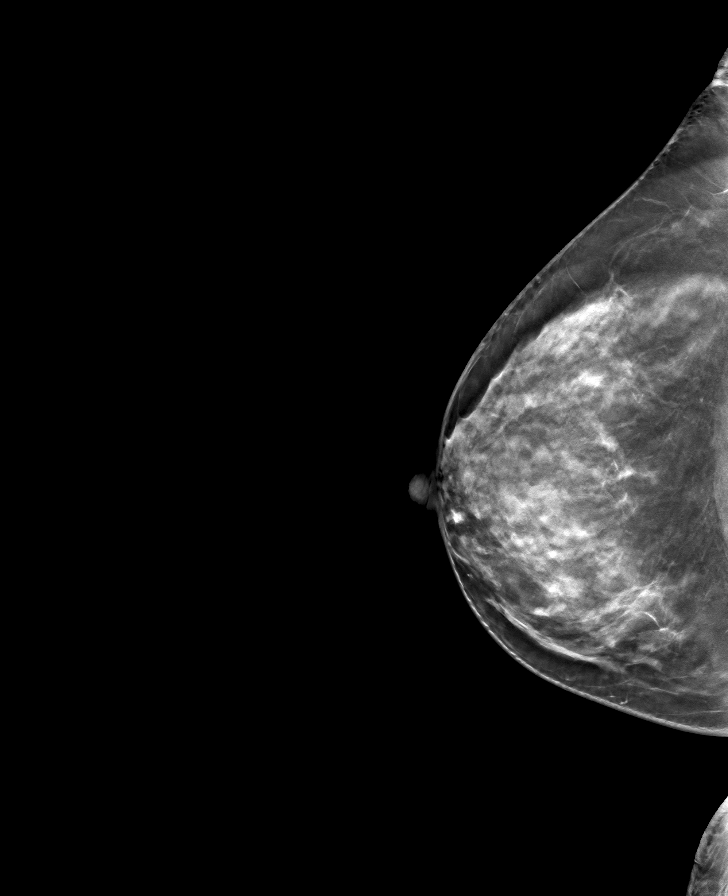

[L CC tomo · tomo slice 37/72.0]
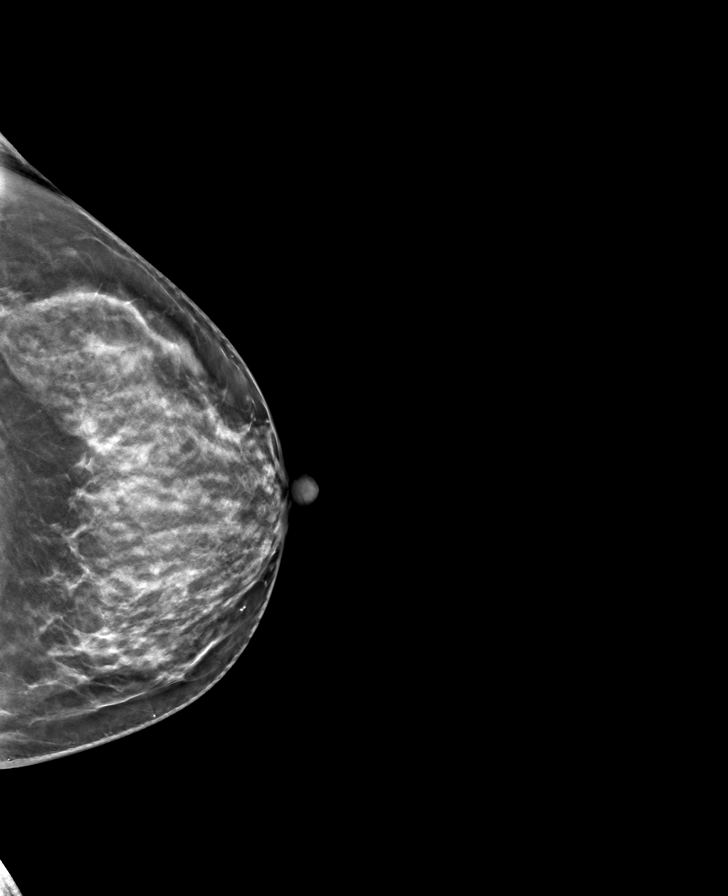

[8 of 24 positions shown; findings below may reference images not displayed]

ACR Breast Density Category c: The breast tissue is heterogeneously
dense, which may obscure small masses
FINDINGS: There are no findings suspicious for malignancy.
IMPRESSION: No mammographic evidence of malignancy. A result letter of this
screening mammogram will be mailed directly to the patient.

RECOMMENDATION:
Screening mammogram in one year. (Code:C8-T-HNK)

BI-RADS CATEGORY  1: Negative.

## 2022-12-31 DIAGNOSIS — R933 Abnormal findings on diagnostic imaging of other parts of digestive tract: Secondary | ICD-10-CM | POA: Diagnosis not present

## 2022-12-31 DIAGNOSIS — K219 Gastro-esophageal reflux disease without esophagitis: Secondary | ICD-10-CM | POA: Diagnosis not present

## 2022-12-31 DIAGNOSIS — R1312 Dysphagia, oropharyngeal phase: Secondary | ICD-10-CM | POA: Diagnosis not present

## 2023-01-20 DIAGNOSIS — K449 Diaphragmatic hernia without obstruction or gangrene: Secondary | ICD-10-CM | POA: Diagnosis not present

## 2023-01-20 DIAGNOSIS — I8500 Esophageal varices without bleeding: Secondary | ICD-10-CM | POA: Diagnosis not present

## 2023-01-20 DIAGNOSIS — R1310 Dysphagia, unspecified: Secondary | ICD-10-CM | POA: Diagnosis not present

## 2023-01-20 DIAGNOSIS — R1314 Dysphagia, pharyngoesophageal phase: Secondary | ICD-10-CM | POA: Diagnosis not present

## 2023-01-20 DIAGNOSIS — Z79890 Hormone replacement therapy (postmenopausal): Secondary | ICD-10-CM | POA: Diagnosis not present

## 2023-01-20 DIAGNOSIS — K222 Esophageal obstruction: Secondary | ICD-10-CM | POA: Diagnosis not present

## 2023-01-20 DIAGNOSIS — K225 Diverticulum of esophagus, acquired: Secondary | ICD-10-CM | POA: Diagnosis not present

## 2023-01-20 DIAGNOSIS — K2100 Gastro-esophageal reflux disease with esophagitis, without bleeding: Secondary | ICD-10-CM | POA: Diagnosis not present

## 2023-01-20 DIAGNOSIS — E039 Hypothyroidism, unspecified: Secondary | ICD-10-CM | POA: Diagnosis not present

## 2023-01-20 DIAGNOSIS — K311 Adult hypertrophic pyloric stenosis: Secondary | ICD-10-CM | POA: Diagnosis not present

## 2023-01-20 DIAGNOSIS — F17210 Nicotine dependence, cigarettes, uncomplicated: Secondary | ICD-10-CM | POA: Diagnosis not present

## 2023-01-20 DIAGNOSIS — Z9889 Other specified postprocedural states: Secondary | ICD-10-CM | POA: Diagnosis not present

## 2023-01-20 DIAGNOSIS — Z79899 Other long term (current) drug therapy: Secondary | ICD-10-CM | POA: Diagnosis not present

## 2023-01-22 DIAGNOSIS — K319 Disease of stomach and duodenum, unspecified: Secondary | ICD-10-CM | POA: Diagnosis not present

## 2023-01-22 DIAGNOSIS — K2100 Gastro-esophageal reflux disease with esophagitis, without bleeding: Secondary | ICD-10-CM | POA: Diagnosis not present

## 2023-01-29 DIAGNOSIS — K225 Diverticulum of esophagus, acquired: Secondary | ICD-10-CM | POA: Diagnosis not present

## 2023-01-29 DIAGNOSIS — R1310 Dysphagia, unspecified: Secondary | ICD-10-CM | POA: Diagnosis not present

## 2023-01-29 DIAGNOSIS — Q396 Congenital diverticulum of esophagus: Secondary | ICD-10-CM | POA: Diagnosis not present

## 2023-02-02 DIAGNOSIS — K219 Gastro-esophageal reflux disease without esophagitis: Secondary | ICD-10-CM | POA: Diagnosis not present

## 2023-02-02 DIAGNOSIS — R1312 Dysphagia, oropharyngeal phase: Secondary | ICD-10-CM | POA: Diagnosis not present

## 2023-02-02 DIAGNOSIS — K5100 Ulcerative (chronic) pancolitis without complications: Secondary | ICD-10-CM | POA: Diagnosis not present

## 2023-02-02 DIAGNOSIS — R933 Abnormal findings on diagnostic imaging of other parts of digestive tract: Secondary | ICD-10-CM | POA: Diagnosis not present

## 2023-03-16 DIAGNOSIS — J209 Acute bronchitis, unspecified: Secondary | ICD-10-CM | POA: Diagnosis not present

## 2023-03-16 DIAGNOSIS — R051 Acute cough: Secondary | ICD-10-CM | POA: Diagnosis not present

## 2023-03-16 DIAGNOSIS — Z719 Counseling, unspecified: Secondary | ICD-10-CM | POA: Diagnosis not present

## 2023-03-16 DIAGNOSIS — J069 Acute upper respiratory infection, unspecified: Secondary | ICD-10-CM | POA: Diagnosis not present

## 2023-04-28 DIAGNOSIS — Z720 Tobacco use: Secondary | ICD-10-CM | POA: Diagnosis not present

## 2023-04-28 DIAGNOSIS — K2100 Gastro-esophageal reflux disease with esophagitis, without bleeding: Secondary | ICD-10-CM | POA: Diagnosis not present

## 2023-04-28 DIAGNOSIS — R490 Dysphonia: Secondary | ICD-10-CM | POA: Diagnosis not present

## 2023-04-28 DIAGNOSIS — K225 Diverticulum of esophagus, acquired: Secondary | ICD-10-CM | POA: Diagnosis not present

## 2023-06-08 DIAGNOSIS — Z01818 Encounter for other preprocedural examination: Secondary | ICD-10-CM | POA: Diagnosis not present

## 2023-06-09 DIAGNOSIS — F411 Generalized anxiety disorder: Secondary | ICD-10-CM | POA: Diagnosis not present

## 2023-06-09 DIAGNOSIS — F17210 Nicotine dependence, cigarettes, uncomplicated: Secondary | ICD-10-CM | POA: Diagnosis not present

## 2023-06-09 DIAGNOSIS — E039 Hypothyroidism, unspecified: Secondary | ICD-10-CM | POA: Diagnosis not present

## 2023-06-23 DIAGNOSIS — Z79890 Hormone replacement therapy (postmenopausal): Secondary | ICD-10-CM | POA: Diagnosis not present

## 2023-06-23 DIAGNOSIS — K9171 Accidental puncture and laceration of a digestive system organ or structure during a digestive system procedure: Secondary | ICD-10-CM | POA: Diagnosis not present

## 2023-06-23 DIAGNOSIS — Z23 Encounter for immunization: Secondary | ICD-10-CM | POA: Diagnosis not present

## 2023-06-23 DIAGNOSIS — F17210 Nicotine dependence, cigarettes, uncomplicated: Secondary | ICD-10-CM | POA: Diagnosis not present

## 2023-06-23 DIAGNOSIS — Y658 Other specified misadventures during surgical and medical care: Secondary | ICD-10-CM | POA: Diagnosis not present

## 2023-06-23 DIAGNOSIS — R1310 Dysphagia, unspecified: Secondary | ICD-10-CM | POA: Diagnosis not present

## 2023-06-23 DIAGNOSIS — Y92234 Operating room of hospital as the place of occurrence of the external cause: Secondary | ICD-10-CM | POA: Diagnosis not present

## 2023-06-23 DIAGNOSIS — Z8711 Personal history of peptic ulcer disease: Secondary | ICD-10-CM | POA: Diagnosis not present

## 2023-06-23 DIAGNOSIS — E039 Hypothyroidism, unspecified: Secondary | ICD-10-CM | POA: Diagnosis not present

## 2023-06-23 DIAGNOSIS — K225 Diverticulum of esophagus, acquired: Secondary | ICD-10-CM | POA: Diagnosis not present

## 2023-07-02 DIAGNOSIS — K225 Diverticulum of esophagus, acquired: Secondary | ICD-10-CM | POA: Diagnosis not present

## 2023-07-05 DIAGNOSIS — K225 Diverticulum of esophagus, acquired: Secondary | ICD-10-CM | POA: Diagnosis not present
